# Patient Record
Sex: Female | Born: 1955 | Race: Black or African American | Hispanic: No | Smoking: Never smoker
Health system: Southern US, Community
[De-identification: ages and names within clinical notes are randomized; demographics above are authoritative.]

## PROBLEM LIST (undated history)

## (undated) DIAGNOSIS — I10 Essential (primary) hypertension: Secondary | ICD-10-CM

## (undated) DIAGNOSIS — J45909 Unspecified asthma, uncomplicated: Secondary | ICD-10-CM

---

## 2014-10-25 ENCOUNTER — Telehealth: Payer: Self-pay | Admitting: General Practice

## 2014-10-25 NOTE — Telephone Encounter (Signed)
Patient informed and patient stated she will be applying for [place] Medicaid, advised patient we do not accept medicaid patient voice understanding and will find another provider.

## 2014-10-25 NOTE — Telephone Encounter (Signed)
Relation to ZO:XWRU Call back number: [phone]   Reason for call:  Patient mother and sister Adele Schilder)  are patients of PCP. Patient is new to [place] and would like to establish care with PCP. Patient will follow to [place].

## 2014-10-25 NOTE — Telephone Encounter (Signed)
Ok to establish 

## 2019-02-13 ENCOUNTER — Emergency Department (HOSPITAL_BASED_OUTPATIENT_CLINIC_OR_DEPARTMENT_OTHER)
Admission: EM | Admit: 2019-02-13 | Discharge: 2019-02-13 | Disposition: A | Discharge status: Home / Self Care | Payer: Medicare Other | Attending: Emergency Medicine | Admitting: Emergency Medicine

## 2019-02-13 ENCOUNTER — Other Ambulatory Visit: Payer: Self-pay

## 2019-02-13 ENCOUNTER — Encounter (HOSPITAL_BASED_OUTPATIENT_CLINIC_OR_DEPARTMENT_OTHER): Payer: Self-pay

## 2019-02-13 ENCOUNTER — Emergency Department (HOSPITAL_BASED_OUTPATIENT_CLINIC_OR_DEPARTMENT_OTHER): Payer: Medicare Other

## 2019-02-13 DIAGNOSIS — J45909 Unspecified asthma, uncomplicated: Secondary | ICD-10-CM | POA: Diagnosis not present

## 2019-02-13 DIAGNOSIS — R109 Unspecified abdominal pain: Secondary | ICD-10-CM | POA: Diagnosis present

## 2019-02-13 DIAGNOSIS — K5732 Diverticulitis of large intestine without perforation or abscess without bleeding: Secondary | ICD-10-CM | POA: Diagnosis not present

## 2019-02-13 DIAGNOSIS — Z79899 Other long term (current) drug therapy: Secondary | ICD-10-CM | POA: Insufficient documentation

## 2019-02-13 DIAGNOSIS — I10 Essential (primary) hypertension: Secondary | ICD-10-CM | POA: Insufficient documentation

## 2019-02-13 HISTORY — DX: Unspecified asthma, uncomplicated: J45.909

## 2019-02-13 HISTORY — DX: Essential (primary) hypertension: I10

## 2019-02-13 LAB — COMPREHENSIVE METABOLIC PANEL
ALT: 22 U/L (ref 0–44)
AST: 22 U/L (ref 15–41)
Albumin: 4.3 g/dL (ref 3.5–5.0)
Alkaline Phosphatase: 77 U/L (ref 38–126)
Anion gap: 10 (ref 5–15)
BUN: 11 mg/dL (ref 8–23)
CO2: 23 mmol/L (ref 22–32)
Calcium: 9.3 mg/dL (ref 8.9–10.3)
Chloride: 102 mmol/L (ref 98–111)
Creatinine, Ser: 1.16 mg/dL — ABNORMAL HIGH (ref 0.44–1.00)
GFR calc Af Amer: 58 mL/min — ABNORMAL LOW (ref >60)
GFR calc non Af Amer: 50 mL/min — ABNORMAL LOW (ref >60)
Glucose, Bld: 108 mg/dL — ABNORMAL HIGH (ref 70–99)
Potassium: 3.5 mmol/L (ref 3.5–5.1)
Sodium: 135 mmol/L (ref 135–145)
Total Bilirubin: 0.6 mg/dL (ref 0.3–1.2)
Total Protein: 7.9 g/dL (ref 6.5–8.1)

## 2019-02-13 LAB — URINALYSIS, ROUTINE W REFLEX MICROSCOPIC
Bilirubin Urine: NEGATIVE
Glucose, UA: NEGATIVE mg/dL
Hgb urine dipstick: NEGATIVE
Ketones, ur: NEGATIVE mg/dL
Nitrite: NEGATIVE
Protein, ur: NEGATIVE mg/dL
Specific Gravity, Urine: 1.01 (ref 1.005–1.030)
pH: 6 (ref 5.0–8.0)

## 2019-02-13 LAB — CBC
HCT: 42.1 % (ref 36.0–46.0)
Hemoglobin: 13.9 g/dL (ref 12.0–15.0)
MCH: 30.1 pg (ref 26.0–34.0)
MCHC: 33 g/dL (ref 30.0–36.0)
MCV: 91.1 fL (ref 80.0–100.0)
Platelets: 262 10*3/uL (ref 150–400)
RBC: 4.62 MIL/uL (ref 3.87–5.11)
RDW: 12.4 % (ref 11.5–15.5)
WBC: 12.5 10*3/uL — ABNORMAL HIGH (ref 4.0–10.5)
nRBC: 0 % (ref 0.0–0.2)

## 2019-02-13 LAB — URINALYSIS, MICROSCOPIC (REFLEX)

## 2019-02-13 LAB — LIPASE, BLOOD: Lipase: 34 U/L (ref 11–51)

## 2019-02-13 MED ORDER — MORPHINE SULFATE (PF) 4 MG/ML IV SOLN
4.0000 mg | Freq: Once | INTRAVENOUS | Status: AC
  Administered 2019-02-13: 15:00:00 4 mg via INTRAVENOUS
  Filled 2019-02-13: qty 1

## 2019-02-13 MED ORDER — HYDROCODONE-ACETAMINOPHEN 5-325 MG PO TABS: 1.0000 | ORAL_TABLET | Freq: Four times a day (QID) | ORAL | 0 refills | Status: AC | PRN

## 2019-02-13 MED ORDER — CIPROFLOXACIN HCL 500 MG PO TABS: 500.0000 mg | ORAL_TABLET | Freq: Two times a day (BID) | ORAL | 0 refills | Status: AC

## 2019-02-13 MED ORDER — METRONIDAZOLE 500 MG PO TABS: 500.0000 mg | ORAL_TABLET | Freq: Two times a day (BID) | ORAL | 0 refills | Status: AC

## 2019-02-13 MED ORDER — SODIUM CHLORIDE 0.9 % IV BOLUS
1000.0000 mL | Freq: Once | INTRAVENOUS | Status: AC
  Administered 2019-02-13: 15:00:00 1000 mL via INTRAVENOUS

## 2019-02-13 MED ORDER — CIPROFLOXACIN HCL 500 MG PO TABS
500.0000 mg | ORAL_TABLET | Freq: Once | ORAL | Status: AC
  Administered 2019-02-13: 500 mg via ORAL
  Filled 2019-02-13: qty 1

## 2019-02-13 MED ORDER — IOHEXOL 300 MG/ML  SOLN
100.0000 mL | Freq: Once | INTRAMUSCULAR | Status: AC | PRN
  Administered 2019-02-13: 16:00:00 100 mL via INTRAVENOUS

## 2019-02-13 MED ORDER — ONDANSETRON 4 MG PO TBDP: ORAL_TABLET | ORAL | 0 refills | Status: AC

## 2019-02-13 MED ORDER — ONDANSETRON HCL 4 MG/2ML IJ SOLN
4.0000 mg | Freq: Once | INTRAMUSCULAR | Status: AC
  Administered 2019-02-13: 15:00:00 4 mg via INTRAVENOUS
  Filled 2019-02-13: qty 2

## 2019-02-13 MED ORDER — METRONIDAZOLE 500 MG PO TABS: 500.0000 mg | ORAL_TABLET | Freq: Two times a day (BID) | ORAL | 0 refills | Status: DC

## 2019-02-13 MED ORDER — METRONIDAZOLE 500 MG PO TABS
500.0000 mg | ORAL_TABLET | Freq: Once | ORAL | Status: AC
  Administered 2019-02-13: 17:00:00 500 mg via ORAL
  Filled 2019-02-13: qty 1

## 2019-02-13 NOTE — ED Notes (Signed)
Taken to CT.

## 2019-02-13 NOTE — ED Provider Notes (Signed)
[facility] EMERGENCY DEPARTMENT Provider Note   CSN: [id number] Arrival date & time: 02/13/19  1138     History Chief Complaint  Patient presents with  . Abdominal Pain    Chelsea Schmidt is a 64 y.o. female.  Chelsea Schmidt is a 64 y.o. female with a history of hypertension, who presents to the emergency department for evaluation of lower abdominal pain.  She reports around 3 AM this morning she woke up experiencing sharp stabbing pains in her lower abdomen that had been constant throughout the day.  She states pain is present on both sides but is sometimes worse on the right.  She has not had any associated vomiting or diarrhea but does report some decreased appetite.  She denies any constipation, states she takes a daily fiber supplement to help keep her bowels moving regularly.  She has not noticed any dark or bloody bowel movements.  No fevers or chills.  No burning or pain with urination, no flank pain or hematuria.  No chest pain or shortness of breath.  She has not had any abdominal surgeries aside from tubal ligation many years ago.  Denies history of similar symptoms, has not taken anything to treat the symptoms prior to arrival.  No other aggravating or alleviating factors.        Past Medical History:  Diagnosis Date  . Asthma   . Hypertension     There are no problems to display for this patient.   History reviewed. No pertinent surgical history.   OB History   No obstetric history on file.     No family history on file.  Social History   Tobacco Use  . Smoking status: Never Smoker  . Smokeless tobacco: Never Used  Substance Use Topics  . Alcohol use: Never  . Drug use: Never    Home Medications Prior to Admission medications   Medication Sig Start Date End Date Taking? Authorizing Provider  ALPRAZolam Duanne Moron) 0.5 MG tablet Take 1 to 3 tabs a day as needed for anxiety/panic attacks 01/26/19  Yes [provider]  benazepril  (LOTENSIN) 20 MG tablet Take by mouth. 09/18/16  Yes [provider]  benazepril-hydrochlorthiazide (LOTENSIN HCT) 20-12.5 MG tablet Take by mouth. 08/06/18  Yes [provider]  DULoxetine (CYMBALTA) 60 MG capsule Take  2 a day 08/12/16  Yes [provider]  metoprolol succinate (TOPROL-XL) 50 MG 24 hr tablet Take by mouth. 02/20/18 02/20/19 Yes [provider]  risperiDONE (RISPERDAL) 3 MG tablet Take 1 at night 01/26/19  Yes [provider]  simvastatin (ZOCOR) 40 MG tablet TAKE 1 TABLET BY MOUTH  NIGHTLY 10/02/16  Yes [provider]  topiramate (TOPAMAX) 50 MG tablet Take 2 tab daily 01/26/19  Yes [provider]  ciprofloxacin (CIPRO) 500 MG tablet Take 1 tablet (500 mg total) by mouth 2 (two) times daily. One po bid x 7 days 02/13/19   Jacqlyn Larsen, PA-C  HYDROcodone-acetaminophen (NORCO/VICODIN) 5-325 MG tablet Take 1-2 tablets by mouth every 6 (six) hours as needed. 02/13/19   Jacqlyn Larsen, PA-C  metroNIDAZOLE (FLAGYL) 500 MG tablet Take 1 tablet (500 mg total) by mouth 2 (two) times daily. One po bid x 7 days 02/13/19   Jacqlyn Larsen, PA-C  Multiple Vitamin (MULTIVITAMIN) capsule Take by mouth.    [provider]  ondansetron (ZOFRAN ODT) 4 MG disintegrating tablet 4mg  ODT q4 hours prn nausea/vomit 02/13/19   Jacqlyn Larsen, PA-C  Allergies    Aripiprazole  Review of Systems   Review of Systems  Constitutional: Positive for appetite change. Negative for chills and fever.  HENT: Negative.   Respiratory: Negative for cough and shortness of breath.   Cardiovascular: Negative for chest pain.  Gastrointestinal: Positive for abdominal pain. Negative for blood in stool, constipation, diarrhea, nausea and vomiting.  Genitourinary: Negative for dysuria, flank pain, frequency, hematuria, vaginal bleeding and vaginal discharge.  Musculoskeletal: Negative for back pain.  Skin: Negative for color change and rash.  Neurological:  Negative for dizziness, syncope and light-headedness.    Physical Exam Updated Vital Signs BP 107/62 (BP Location: Left Arm)   Pulse 84   Temp 98.5 F (36.9 C) (Oral)   Resp 16   Ht 5\' 5"  (1.651 m)   Wt 86.2 kg   SpO2 97%   BMI 31.62 kg/m   Physical Exam Vitals and nursing note reviewed.  Constitutional:      General: She is not in acute distress.    Appearance: She is well-developed. She is obese. She is not ill-appearing or diaphoretic.  HENT:     Head: Normocephalic and atraumatic.  Eyes:     General:        Right eye: No discharge.        Left eye: No discharge.     Pupils: Pupils are equal, round, and reactive to light.  Cardiovascular:     Rate and Rhythm: Normal rate and regular rhythm.     Heart sounds: Normal heart sounds.  Pulmonary:     Effort: Pulmonary effort is normal. No respiratory distress.     Breath sounds: Normal breath sounds. No wheezing or rales.     Comments: Respirations equal and unlabored, patient able to speak in full sentences, lungs clear to auscultation bilaterally Abdominal:     General: Bowel sounds are normal. There is no distension.     Palpations: Abdomen is soft. There is no mass.     Tenderness: There is abdominal tenderness in the right lower quadrant, suprapubic area and left lower quadrant. There is no guarding.     Comments: Abdomen is soft, nondistended, bowel sounds present throughout, there is tenderness across the lower abdomen, worse on the right than the left, no focal guarding, rigidity or peritoneal signs.  No CVA tenderness bilaterally.  Musculoskeletal:        General: No deformity.     Cervical back: Neck supple.  Skin:    General: Skin is warm and dry.     Capillary Refill: Capillary refill takes less than 2 seconds.  Neurological:     Mental Status: She is alert.     Coordination: Coordination normal.     Comments: Speech is clear, able to follow commands Moves extremities without ataxia, coordination intact    Psychiatric:        Mood and Affect: Mood normal.        Behavior: Behavior normal.     ED Results / Procedures / Treatments   Labs (all labs ordered are listed, but only abnormal results are displayed) Labs Reviewed  COMPREHENSIVE METABOLIC PANEL - Abnormal; Notable for the following components:      Result Value   Glucose, Bld 108 (*)    Creatinine, Ser 1.16 (*)    GFR calc non Af Amer 50 (*)    GFR calc Af Amer 58 (*)    All other components within normal limits  CBC - Abnormal; Notable for the following components:  WBC 12.5 (*)    All other components within normal limits  URINALYSIS, ROUTINE W REFLEX MICROSCOPIC - Abnormal; Notable for the following components:   Leukocytes,Ua TRACE (*)    All other components within normal limits  URINALYSIS, MICROSCOPIC (REFLEX) - Abnormal; Notable for the following components:   Bacteria, UA RARE (*)    All other components within normal limits  LIPASE, BLOOD    EKG None  Radiology CT Abdomen Pelvis W Contrast  Result Date: 02/13/2019 CLINICAL DATA:  Right lower quadrant abdominal pain EXAM: CT ABDOMEN AND PELVIS WITH CONTRAST TECHNIQUE: Multidetector CT imaging of the abdomen and pelvis was performed using the standard protocol following bolus administration of intravenous contrast. CONTRAST:  OMNIPAQUE IOHEXOL 300 MG/ML  SOLN COMPARISON:  None. FINDINGS: Lower chest: There is mild bibasilar atelectasis. Hepatobiliary: No focal liver abnormality is seen. No gallstones, gallbladder wall thickening, or biliary dilatation. Pancreas: Unremarkable. No pancreatic ductal dilatation or surrounding inflammatory changes. Spleen: Normal in size without focal abnormality. Adrenals/Urinary Tract: Adrenal glands are unremarkable. Kidneys are normal, without renal calculi, focal lesion, or hydronephrosis. Bladder is unremarkable. Stomach/Bowel: Stomach is within normal limits. There is a short segment of bowel wall thickening and associated fat  stranding involving a short segment of the sigmoid colon, consistent with acute diverticulitis. There is no evidence of fistula or abscess formation. There is diverticulosis without evidence of diverticulitis involving the descending colon. Appendix appears normal. No evidence of bowel obstruction. Vascular/Lymphatic: No significant vascular findings are present. No enlarged abdominal or pelvic lymph nodes. Reproductive: Uterus and bilateral adnexa are unremarkable. Other: No abdominal wall hernia or abnormality. No abdominopelvic ascites. Musculoskeletal: No acute or significant osseous findings. IMPRESSION: 1. Bowel wall thickening and associated fat stranding involving a short segment of the sigmoid colon, consistent with acute diverticulitis. No evidence of fistula or abscess formation. Electronically Signed   By: Romona Curls M.D.   On: 02/13/2019 16:05    Procedures Procedures (including critical care time)  Medications Ordered in ED Medications  sodium chloride 0.9 % bolus 1,000 mL (0 mLs Intravenous Stopped 02/13/19 1726)  ondansetron (ZOFRAN) injection 4 mg (4 mg Intravenous Given 02/13/19 1456)  morphine 4 MG/ML injection 4 mg (4 mg Intravenous Given 02/13/19 1456)  iohexol (OMNIPAQUE) 300 MG/ML solution 100 mL (100 mLs Intravenous Contrast Given 02/13/19 1548)  metroNIDAZOLE (FLAGYL) tablet 500 mg (500 mg Oral Given 02/13/19 1637)  ciprofloxacin (CIPRO) tablet 500 mg (500 mg Oral Given 02/13/19 1637)    ED Course  I have reviewed the triage vital signs and the nursing notes.  Pertinent labs & imaging results that were available during my care of the patient were reviewed by me and considered in my medical decision making (see chart for details).  Clinical Course as of Feb 12 1741  Sat Feb 13, 2019  4578 64 year old female presents with sharp pains across her lower abdomen that began at 3 AM this morning no associated vomiting or abnormal bowel movements, no fevers.  No associated urinary  symptoms, vaginal discharge or bleeding.  Differential includes diverticulitis, appendicitis, colitis, without urinary symptoms feel UTI is less likely.   [KF]  1520 CBC with mild leukocytosis, normal hemoglobin  CBC(!) [KF]  1520 Mild bump in creatinine at 1.16, no other significant electrolyte derangements, normal liver function and lipase  Comprehensive metabolic panel(!) [KF]  1520 Urinalysis without signs of infection  Urinalysis, Routine w reflex microscopic(!) [KF]  1615 CT consistent with uncomplicated sigmoid diverticulitis  CT Abdomen Pelvis W  Contrast [KF]    Clinical Course User Index [KF] Legrand Rams   MDM Rules/Calculators/A&P                      64 year old female presents with sharp stabbing lower abdominal pains that began this morning.  Lab work is overall reassuring, CT is consistent with uncomplicated diverticulitis her pain has improved significantly here in the ED and she is tolerating p.o., she has been given her first dose of antibiotics, will send home with treatment with Cipro, Flagyl and pain medication.  PCP and GI follow-up encouraged.  Strict return precautions discussed.  Patient expresses understanding and agreement.  Discharged home in good condition.  Final Clinical Impression(s) / ED Diagnoses Final diagnoses:  Sigmoid diverticulitis    Rx / DC Orders ED Discharge Orders         Ordered    ciprofloxacin (CIPRO) 500 MG tablet  2 times daily     02/13/19 1733    metroNIDAZOLE (FLAGYL) 500 MG tablet  2 times daily,   Status:  Discontinued     02/13/19 1733    HYDROcodone-acetaminophen (NORCO/VICODIN) 5-325 MG tablet  Every 6 hours PRN     02/13/19 1733    ondansetron (ZOFRAN ODT) 4 MG disintegrating tablet     02/13/19 1733    metroNIDAZOLE (FLAGYL) 500 MG tablet  2 times daily     02/13/19 1739           Dartha Lodge, [place] 02/13/19 1743    Tegeler, Canary Brim, MD 02/14/19 [phone]

## 2019-02-13 NOTE — ED Triage Notes (Signed)
Pt c/o "stabbing pains" in lower abdomen starting around 3 am today. Denies NVD

## 2019-02-13 NOTE — Discharge Instructions (Signed)
Your CT scan shows diverticulitis.  Please take antibiotics twice daily as directed, take these with food on your stomach, you can use hydrocodone as needed for pain, continue using your home bowel regimen to avoid constipation.  Follow-up with your primary care doctor.  If you develop worsening abdominal pain, fevers, persistent vomiting, blood in your stool, return to the ED for reevaluation.

## 2019-03-06 ENCOUNTER — Other Ambulatory Visit: Payer: Self-pay

## 2019-03-06 ENCOUNTER — Emergency Department (HOSPITAL_BASED_OUTPATIENT_CLINIC_OR_DEPARTMENT_OTHER)
Admission: EM | Admit: 2019-03-06 | Discharge: 2019-03-06 | Disposition: A | Discharge status: Home / Self Care | Payer: Medicare Other | Attending: Emergency Medicine | Admitting: Emergency Medicine

## 2019-03-06 ENCOUNTER — Encounter (HOSPITAL_BASED_OUTPATIENT_CLINIC_OR_DEPARTMENT_OTHER): Payer: Self-pay | Admitting: Emergency Medicine

## 2019-03-06 DIAGNOSIS — Z79899 Other long term (current) drug therapy: Secondary | ICD-10-CM | POA: Diagnosis not present

## 2019-03-06 DIAGNOSIS — J45909 Unspecified asthma, uncomplicated: Secondary | ICD-10-CM | POA: Insufficient documentation

## 2019-03-06 DIAGNOSIS — K5732 Diverticulitis of large intestine without perforation or abscess without bleeding: Secondary | ICD-10-CM | POA: Diagnosis not present

## 2019-03-06 DIAGNOSIS — R1032 Left lower quadrant pain: Secondary | ICD-10-CM | POA: Diagnosis present

## 2019-03-06 DIAGNOSIS — I10 Essential (primary) hypertension: Secondary | ICD-10-CM | POA: Diagnosis not present

## 2019-03-06 DIAGNOSIS — K5792 Diverticulitis of intestine, part unspecified, without perforation or abscess without bleeding: Secondary | ICD-10-CM

## 2019-03-06 DIAGNOSIS — Z888 Allergy status to other drugs, medicaments and biological substances status: Secondary | ICD-10-CM | POA: Insufficient documentation

## 2019-03-06 MED ORDER — AMOXICILLIN-POT CLAVULANATE 875-125 MG PO TABS: 1.0000 | ORAL_TABLET | Freq: Two times a day (BID) | ORAL | 0 refills | Status: AC

## 2019-03-06 NOTE — ED Provider Notes (Signed)
[facility] EMERGENCY DEPARTMENT Provider Note   CSN: [id number] Arrival date & time: 03/06/19  5329     History Chief Complaint  Patient presents with  . Abdominal Pain    Chelsea Schmidt is a 64 y.o. female with a past medical history significant for hyperlipidemia, hypertension, asthma, bipolar disease, and history of diverticulitis who presents to the ED due to gradual onset of bilateral lower abdominal pain x 1 week. Patient notes her pain is typically in her LLQ and radiated to her RLQ. Chart reviewed.  Patient was diagnosed with diverticulitis on 02/13/2019 and was prescribed ciprofloxacin and Flagyl x7 days.  Patient states she was compliant with her antibiotics and finished all antibiotics.  Patient notes her abdominal pain returned 1 week after she finished her antibiotics.  Patient denies bloody stools and melena.  Patient notes this pain feels similar in her past episode of diverticulitis.  Patient denies fever and chills.  Patient has not been sexually active in over 10 years and denies concerns for STDs. Patient was seen by GI on 02/25/19 and is scheduled to have an EGD/colonoscopy in mid March. Her last EGD/colonoscopy was on 08/11/15 which demonstrated distal esophagitis (negative for Barrett's), gastritis (negative for H. Pylori), diffuse duodenitis, mild sigmoid diverticulosis, and small internal hemorrhoids. Patient denies vaginal and urinary symptoms. Her only abdominal operation is a tubal ligation. Patient denies nausea, vomiting, shortness of breath, and chest pain.     Past Medical History:  Diagnosis Date  . Asthma   . Hypertension     There are no problems to display for this patient.   History reviewed. No pertinent surgical history.   OB History   No obstetric history on file.     No family history on file.  Social History   Tobacco Use  . Smoking status: Never Smoker  . Smokeless tobacco: Never Used  Substance Use Topics  . Alcohol use:  Never  . Drug use: Never    Home Medications Prior to Admission medications   Medication Sig Start Date End Date Taking? Authorizing Provider  ALPRAZolam Prudy Feeler) 0.5 MG tablet Take 1 to 3 tabs a day as needed for anxiety/panic attacks 01/26/19  Yes [provider]  benazepril-hydrochlorthiazide (LOTENSIN HCT) 20-12.5 MG tablet Take by mouth. 08/06/18  Yes [provider]  DULoxetine (CYMBALTA) 60 MG capsule Take  2 a day 08/12/16  Yes [provider]  Multiple Vitamin (MULTIVITAMIN) capsule Take by mouth.   Yes [provider]  risperiDONE (RISPERDAL) 3 MG tablet Take 1 at night 01/26/19  Yes [provider]  simvastatin (ZOCOR) 40 MG tablet TAKE 1 TABLET BY MOUTH  NIGHTLY 10/02/16  Yes [provider]  topiramate (TOPAMAX) 50 MG tablet Take 2 tab daily 01/26/19  Yes [provider]  amoxicillin-clavulanate (AUGMENTIN) 875-125 MG tablet Take 1 tablet by mouth every 12 (twelve) hours for 14 days. 03/06/19 03/20/19  Mannie Stabile, PA-C  benazepril (LOTENSIN) 20 MG tablet Take by mouth. 09/18/16   [provider]  ciprofloxacin (CIPRO) 500 MG tablet Take 1 tablet (500 mg total) by mouth 2 (two) times daily. One po bid x 7 days 02/13/19   Dartha Lodge, PA-C  HYDROcodone-acetaminophen (NORCO/VICODIN) 5-325 MG tablet Take 1-2 tablets by mouth every 6 (six) hours as needed. 02/13/19   Dartha Lodge, PA-C  metoprolol succinate (TOPROL-XL) 50 MG 24 hr tablet Take by mouth. 02/20/18 02/20/19  [provider]  metroNIDAZOLE (FLAGYL) 500 MG tablet Take 1  tablet (500 mg total) by mouth 2 (two) times daily. One po bid x 7 days 02/13/19   Dartha Lodge, PA-C  ondansetron (ZOFRAN ODT) 4 MG disintegrating tablet 4mg  ODT q4 hours prn nausea/vomit 02/13/19   04/13/19, PA-C    Allergies    Aripiprazole  Review of Systems   Review of Systems  Constitutional: Negative for chills and fever.  Respiratory: Negative for shortness of  breath.   Cardiovascular: Negative for chest pain.  Gastrointestinal: Positive for abdominal pain, constipation (chronic) and diarrhea (1 episode of loose stool this AM). Negative for abdominal distention, anal bleeding, blood in stool, nausea and vomiting.  Genitourinary: Negative for difficulty urinating, dysuria, hematuria, vaginal bleeding, vaginal discharge and vaginal pain.  Musculoskeletal: Negative for back pain.  All other systems reviewed and are negative.   Physical Exam Updated Vital Signs BP 124/83 (BP Location: Left Arm)   Pulse 83   Temp 98.2 F (36.8 C) (Oral)   Resp 16   Ht 5\' 5"  (1.651 m)   Wt 86.2 kg   SpO2 100%   BMI 31.62 kg/m   Physical Exam Vitals and nursing note reviewed.  Constitutional:      General: She is not in acute distress.    Appearance: She is not ill-appearing.  HENT:     Head: Normocephalic.  Eyes:     Pupils: Pupils are equal, round, and reactive to light.  Cardiovascular:     Rate and Rhythm: Normal rate and regular rhythm.     Pulses: Normal pulses.     Heart sounds: Normal heart sounds. No murmur. No friction rub. No gallop.   Pulmonary:     Effort: Pulmonary effort is normal.     Breath sounds: Normal breath sounds.  Abdominal:     General: Abdomen is flat. Bowel sounds are normal. There is no distension.     Palpations: Abdomen is soft.     Tenderness: There is abdominal tenderness. There is no right CVA tenderness, left CVA tenderness, guarding or rebound.     Comments: Abdomen soft, non-distended with mild tenderness to palpation in lower quadrants only with deep palpation. No guarding or rebound. No peritoneal signs. No CVA tenderness bilaterally.   Musculoskeletal:     Cervical back: Neck supple.     Comments: Able to move all 4 extremities without difficulty. No lower extremity edema.   Skin:    General: Skin is warm and dry.  Neurological:     General: No focal deficit present.     Mental Status: She is alert.    Psychiatric:        Mood and Affect: Mood normal.        Behavior: Behavior normal.     ED Results / Procedures / Treatments   Labs (all labs ordered are listed, but only abnormal results are displayed) Labs Reviewed - No data to display  EKG None  Radiology No results found.  Procedures Procedures (including critical care time)  Medications Ordered in ED Medications - No data to display  ED Course  I have reviewed the triage vital signs and the nursing notes.  Pertinent labs & imaging results that were available during my care of the patient were reviewed by me and considered in my medical decision making (see chart for details).    MDM Rules/Calculators/A&P                      64 year old female presents  to the ED due to LLQ abdominal pain that radiates to RLQ. History of diverticulitis on 02/13/19. She was discharged with cipro and Flagyl which she finished. She notes her pain returned roughly a week after she finished her antibiotics. Vitals all within normal limits. Patient in no acute distress and non-ill appearing. Physical exam reassuring. Abdomen soft, non-distended with mild tenderness to palpation in lower quadrants only with deep palpation. No guarding or rebound. No peritoneal signs. No CVA tenderness bilaterally. Patient's last CT on 02/13/19 personally reviewed which demonstrated: 1. Bowel wall thickening and associated fat stranding involving a short segment of the sigmoid colon, consistent with acute diverticulitis. No evidence of fistula or abscess formation.  Suspect recurrent diverticulitis. Given patient has no peritoneal signs, doubt abscess, bowel perforation, bowel obstruction, or other emergent intraabdominal etiology. Discussed case with Dr. Maryan Rued who evaluated patient at bedside and agrees with assessment and plan. Will discharge patient with Augmentin x14 days. Advised patient to follow-up with GI doctor if symptoms do not improve within the next few  days. Strict ED precautions discussed with patient. Patient states understanding and agrees to plan. Patient discharged home in no acute distress and stable vitals   Final Clinical Impression(s) / ED Diagnoses Final diagnoses:  Diverticulitis    Rx / DC Orders ED Discharge Orders         Ordered    amoxicillin-clavulanate (AUGMENTIN) 875-125 MG tablet  Every 12 hours     03/06/19 0955           Suzy Bouchard, PA-C 03/06/19 1630    Blanchie Dessert, MD 03/07/19 [phone]

## 2019-03-06 NOTE — ED Triage Notes (Signed)
Low abd pain for a few days. Hx of diverticulitis. Denies N/V/D

## 2019-03-06 NOTE — Discharge Instructions (Addendum)
As discussed, I am sending you home with another antibiotic. You will take it twice a day for 14 days. Finish all antibiotics even if you start to feel better. If your pain persists after the 14 days, follow-up with your GI doctor. You may take over the counter ibuprofen or tylenol as needed for pain. Return to the ER for worsening pain, fever, bloody stool, or worsening symptoms.

## 2020-07-27 IMAGING — CT CT ABD-PELV W/ CM
2 of 5 series · 17 of 46 positions shown, 19 images · IV contrast (Omnipaque)
Comparison: None.

CLINICAL DATA: Right lower quadrant abdominal pain

EXAM:
CT ABDOMEN AND PELVIS WITH CONTRAST
TECHNIQUE: Multidetector CT imaging of the abdomen and pelvis was performed
using the standard protocol following bolus administration of
intravenous contrast.
CONTRAST:  100mL OMNIPAQUE IOHEXOL 300 MG/ML  SOLN

[Series 2: axial st · axial · 0.82mm/px · z∈[-436,-36]mm · 14 of 90 slices shown, 16 images]
[im 5/90  soft-tissue]
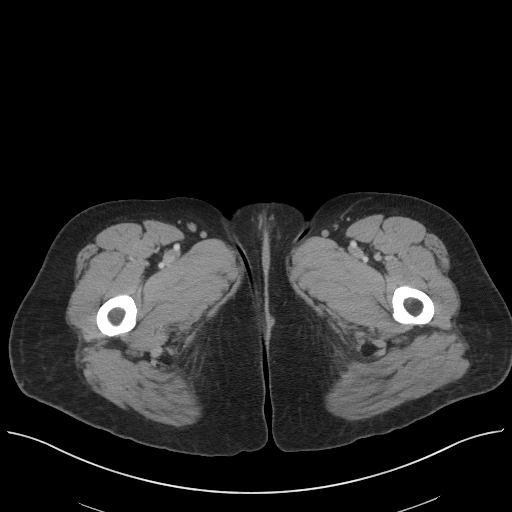
[im 5/90  bone]
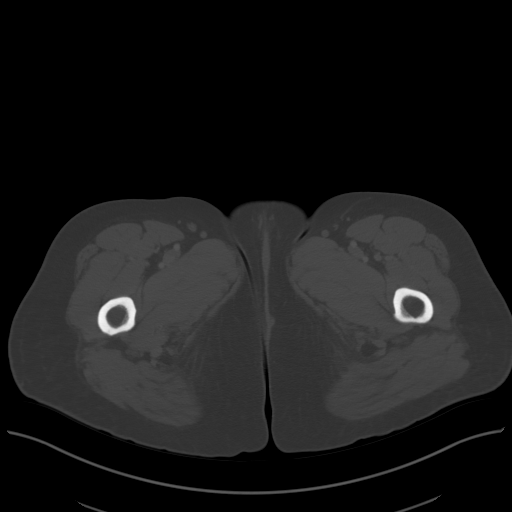
[im 14/90  soft-tissue]
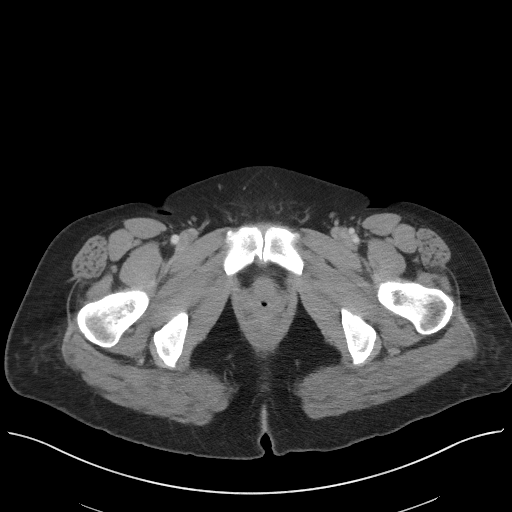
[im 18/90  soft-tissue]
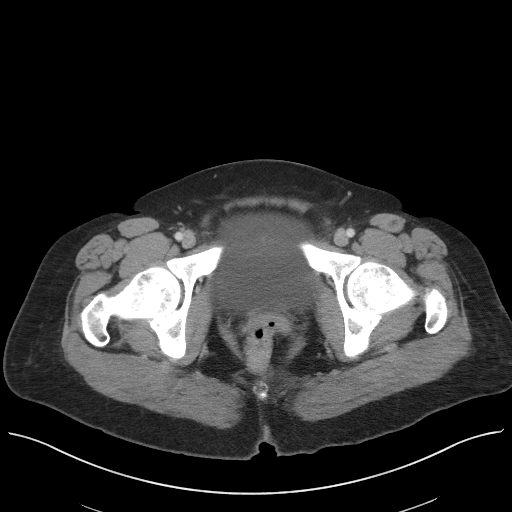
[im 23/90  soft-tissue]
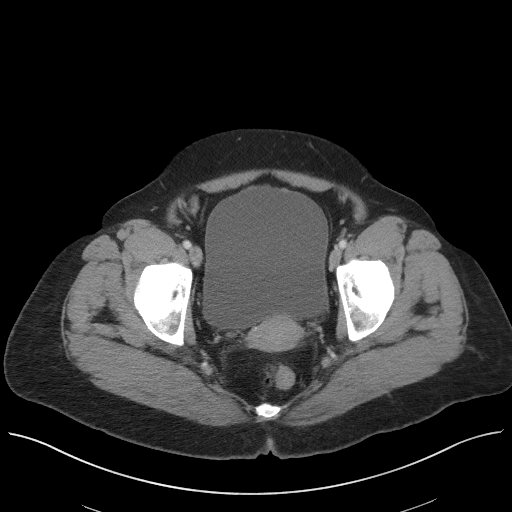
[im 32/90  soft-tissue]
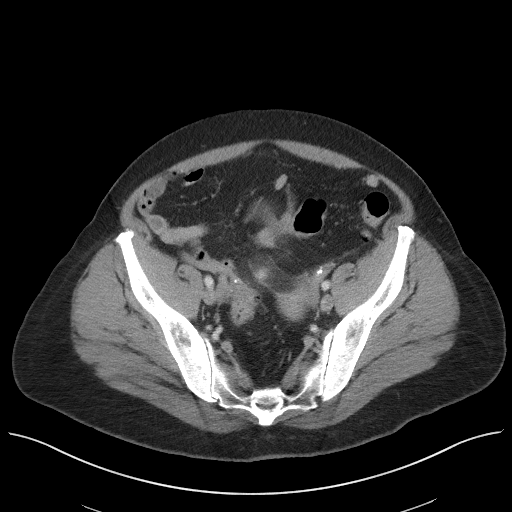
[im 36/90  soft-tissue]
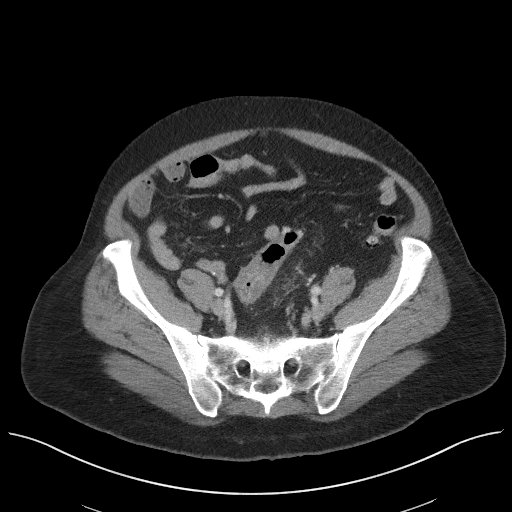
[im 41/90  soft-tissue]
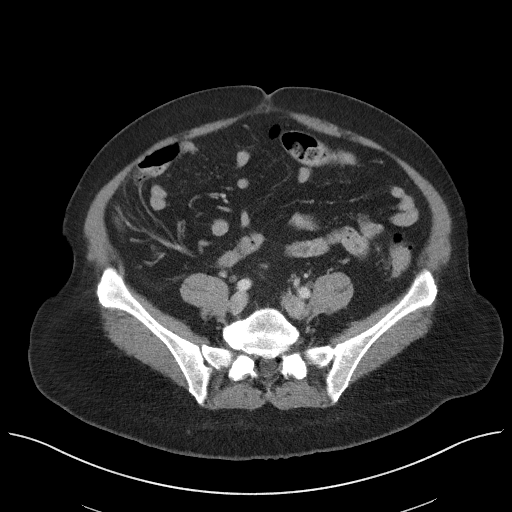
[im 49/90  soft-tissue]
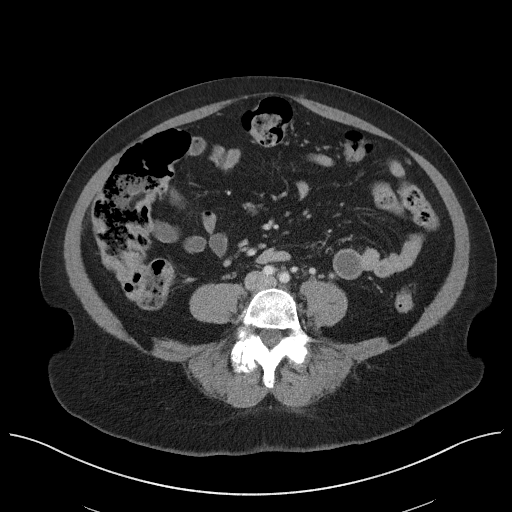
[im 54/90  soft-tissue]
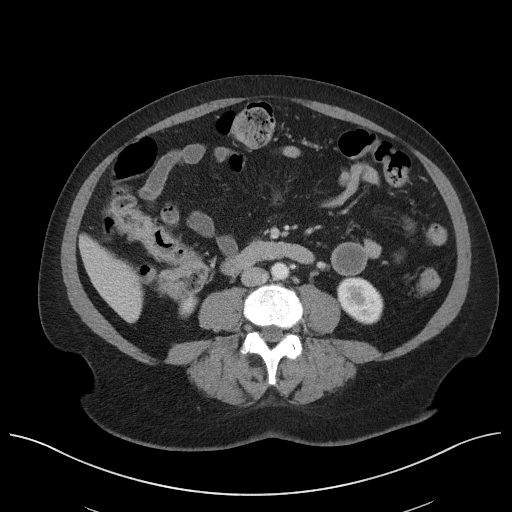
[im 54/90  bone]
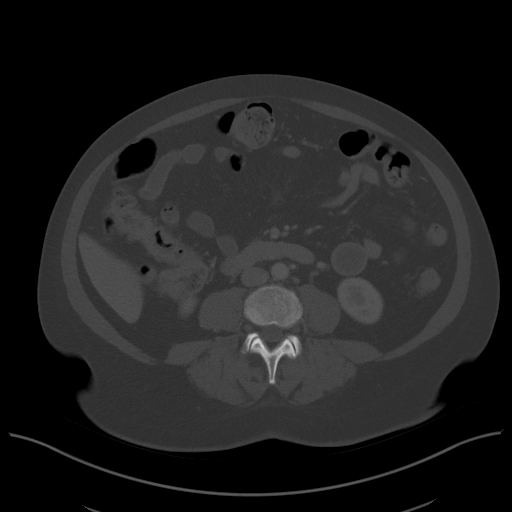
[im 58/90  soft-tissue]
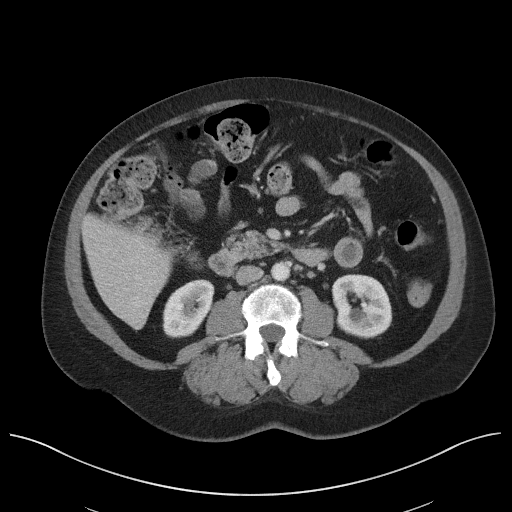
[im 67/90  soft-tissue]
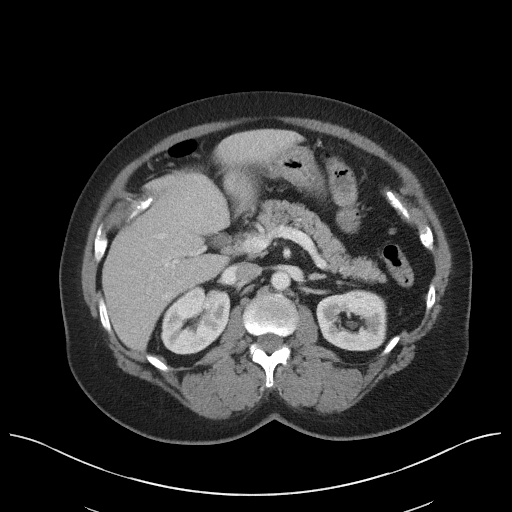
[im 72/90  soft-tissue]
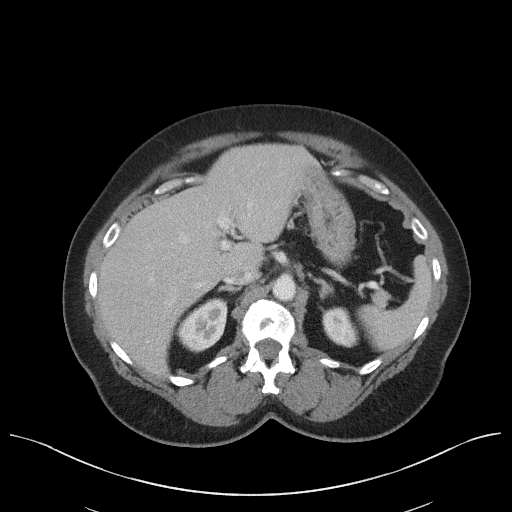
[im 76/90  soft-tissue]
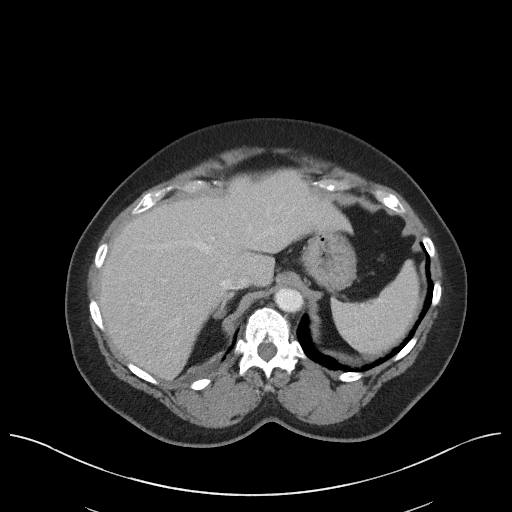
[im 85/90  soft-tissue]
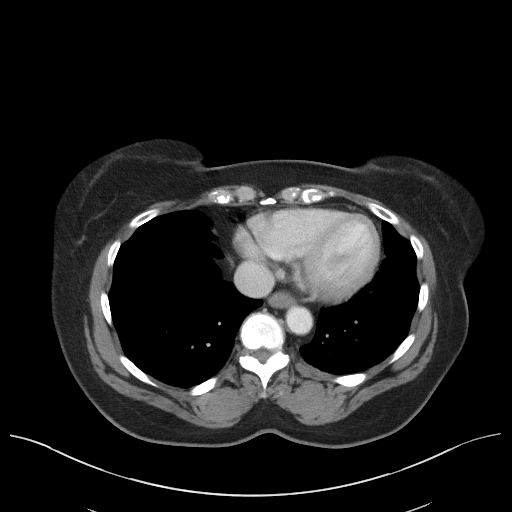

[Series 5: coronal st · coronal · 0.87mm/px · 3 of 101 slices shown]
[im 34/101  soft-tissue]
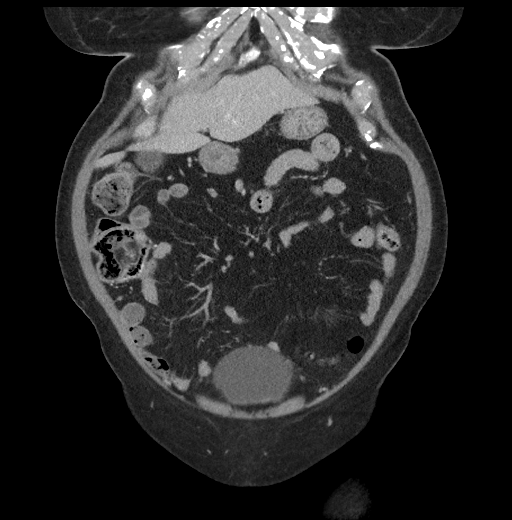
[im 45/101  soft-tissue]
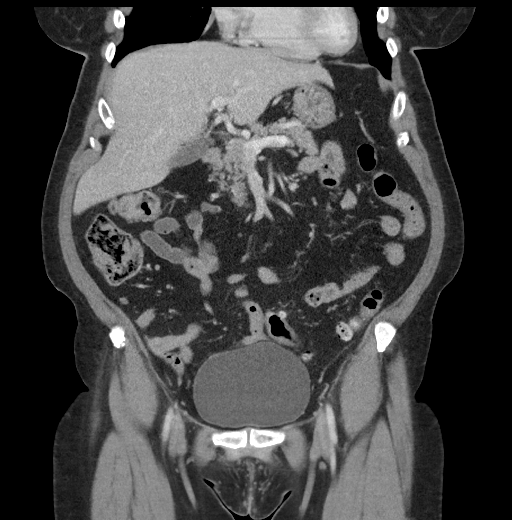
[im 56/101  soft-tissue]
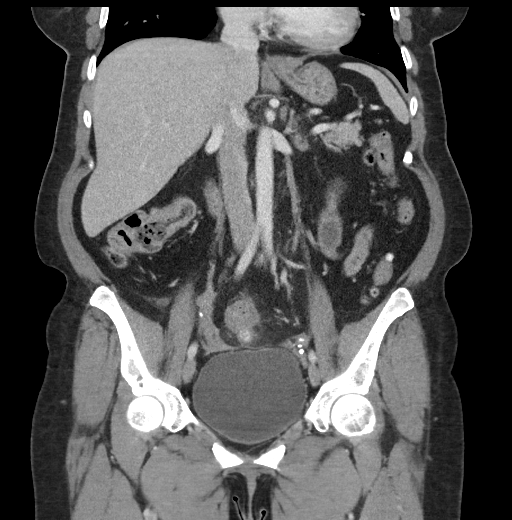

[17 of 46 positions shown; findings below may reference images not displayed]

FINDINGS: Lower chest: There is mild bibasilar atelectasis.

Hepatobiliary: No focal liver abnormality is seen. No gallstones,
gallbladder wall thickening, or biliary dilatation.

Pancreas: Unremarkable. No pancreatic ductal dilatation or
surrounding inflammatory changes.

Spleen: Normal in size without focal abnormality.

Adrenals/Urinary Tract: Adrenal glands are unremarkable. Kidneys are
normal, without renal calculi, focal lesion, or hydronephrosis.
Bladder is unremarkable.

Stomach/Bowel: Stomach is within normal limits. There is a short
segment of bowel wall thickening and associated fat stranding
involving a short segment of the sigmoid colon, consistent with
acute diverticulitis. There is no evidence of fistula or abscess
formation. There is diverticulosis without evidence of
diverticulitis involving the descending colon. Appendix appears
normal. No evidence of bowel obstruction.

Vascular/Lymphatic: No significant vascular findings are present. No
enlarged abdominal or pelvic lymph nodes.

Reproductive: Uterus and bilateral adnexa are unremarkable.

Other: No abdominal wall hernia or abnormality. No abdominopelvic
ascites.

Musculoskeletal: No acute or significant osseous findings.
IMPRESSION: 1. Bowel wall thickening and associated fat stranding involving a
short segment of the sigmoid colon, consistent with acute
diverticulitis. No evidence of fistula or abscess formation.

## 2022-05-06 HISTORY — PX: OTHER SURGICAL HISTORY: SHX169

## 2022-09-08 ENCOUNTER — Encounter (HOSPITAL_BASED_OUTPATIENT_CLINIC_OR_DEPARTMENT_OTHER): Payer: Self-pay | Admitting: Emergency Medicine

## 2022-09-08 ENCOUNTER — Emergency Department (HOSPITAL_BASED_OUTPATIENT_CLINIC_OR_DEPARTMENT_OTHER)
Admission: EM | Admit: 2022-09-08 | Discharge: 2022-09-08 | Disposition: A | Discharge status: Home / Self Care | Payer: Medicare Other | Attending: Emergency Medicine | Admitting: Emergency Medicine

## 2022-09-08 ENCOUNTER — Other Ambulatory Visit: Payer: Self-pay

## 2022-09-08 DIAGNOSIS — I10 Essential (primary) hypertension: Secondary | ICD-10-CM | POA: Insufficient documentation

## 2022-09-08 DIAGNOSIS — Z79899 Other long term (current) drug therapy: Secondary | ICD-10-CM | POA: Diagnosis not present

## 2022-09-08 NOTE — Discharge Instructions (Addendum)
Take your blood pressure once a day.  Continue your blood pressure medicines.  You can bring your blood pressure cuff to the doctor's office to compare it to the readings you get in the office.

## 2022-09-08 NOTE — ED Triage Notes (Signed)
Notices high BP at home seen by PCP . Added hydralazine to regimen. Started Friday.  Some pressure in back of head. And BP still high at home so wants eval

## 2022-09-08 NOTE — ED Provider Notes (Signed)
[place] [facility]PARTMENT AT [facility] Provider Note   CSN: [id number] Arrival date & time: 09/08/22  1638     History  Chief Complaint  Patient presents with   Hypertension    Chelsea Schmidt is a 67 y.o. female.   Hypertension  Patient presents hypertension.  History of hypertension.  Recently seen for hypertension by her PCP on Friday with today being Sunday.  Around 3 weeks ago had her amlodipine stopped because her blood pressure had normalized.  However reported elevated readings at home.  Started on hydralazine.  Of note the blood pressure was normal on the visit on Friday.  Now states still measurements of it going up.  Does have some dull aches at the back of her head she feels at times.  States it tends to be when her blood pressure is up.  No numbness or weakness.  No chest pain.  Takes her measurements on a blood pressure cuff at home but has not correlated with the measurements at the office.     Home Medications Prior to Admission medications   Medication Sig Start Date End Date Taking? Authorizing Provider  ALPRAZolam Prudy Feeler) 0.5 MG tablet Take 1 to 3 tabs a day as needed for anxiety/panic attacks 01/26/19   [provider]  benazepril (LOTENSIN) 20 MG tablet Take by mouth. 09/18/16   [provider]  benazepril-hydrochlorthiazide (LOTENSIN HCT) 20-12.5 MG tablet Take by mouth. 08/06/18   [provider]  ciprofloxacin (CIPRO) 500 MG tablet Take 1 tablet (500 mg total) by mouth 2 (two) times daily. One po bid x 7 days 02/13/19   Dartha Lodge, PA-C  DULoxetine (CYMBALTA) 60 MG capsule Take  2 a day 08/12/16   [provider]  HYDROcodone-acetaminophen (NORCO/VICODIN) 5-325 MG tablet Take 1-2 tablets by mouth every 6 (six) hours as needed. 02/13/19   Dartha Lodge, PA-C  metoprolol succinate (TOPROL-XL) 50 MG 24 hr tablet Take by mouth. 02/20/18 02/20/19  [provider]  metroNIDAZOLE (FLAGYL) 500 MG tablet Take 1  tablet (500 mg total) by mouth 2 (two) times daily. One po bid x 7 days 02/13/19   Dartha Lodge, PA-C  Multiple Vitamin (MULTIVITAMIN) capsule Take by mouth.    [provider]  ondansetron (ZOFRAN ODT) 4 MG disintegrating tablet 4mg  ODT q4 hours prn nausea/vomit 02/13/19   Dartha Lodge, PA-C  risperiDONE (RISPERDAL) 3 MG tablet Take 1 at night 01/26/19   [provider]  simvastatin (ZOCOR) 40 MG tablet TAKE 1 TABLET BY MOUTH  NIGHTLY 10/02/16   [provider]  topiramate (TOPAMAX) 50 MG tablet Take 2 tab daily 01/26/19   [provider]      Allergies    Aripiprazole    Review of Systems   Review of Systems  Physical Exam Updated Vital Signs BP (!) 165/99 (BP Location: Right Arm)   Pulse 70   Temp 98.2 F (36.8 C) (Oral)   Resp 18   SpO2 99%  Physical Exam Vitals and nursing note reviewed.  HENT:     Head: Atraumatic.  Eyes:     Pupils: Pupils are equal, round, and reactive to light.  Cardiovascular:     Rate and Rhythm: Regular rhythm.  Pulmonary:     Effort: Pulmonary effort is normal.  Abdominal:     Tenderness: There is no abdominal tenderness.  Neurological:     Mental Status: She is alert.     ED Results / Procedures / Treatments  Labs (all labs ordered are listed, but only abnormal results are displayed) Labs Reviewed - No data to display  EKG EKG Interpretation Date/Time:  Sunday September 08 2022 17:05:27 EDT Ventricular Rate:  62 PR Interval:  154 QRS Duration:  70 QT Interval:  376 QTC Calculation: 381 R Axis:   55  Text Interpretation: Normal sinus rhythm Possible Left atrial enlargement Borderline ECG No previous ECGs available Confirmed by Benjiman Core [phone]) on 09/08/2022 5:17:57 PM  Radiology No results found.  Procedures Procedures    Medications Ordered in ED Medications - No data to display  ED Course/ Medical Decision Making/ A&P                                 Medical Decision  Making  Patient with hypertension.  History of same.  Recent adjustment of medications.  Doubt endorgan damage.  EKG reassuring.  Reviewed recent kidney function.  No chest pain.  Do not think we need further workup at this time.  Discussed with patient about the nature of high blood pressure.  Will measure her blood pressure once a day.  Will follow-up with PCP.  Just started hydralazine and do not think we need further adjustment of that medicine yet.  Will discharge home.        Final Clinical Impression(s) / ED Diagnoses Final diagnoses:  Hypertension, unspecified type    Rx / DC Orders ED Discharge Orders     None         Benjiman Core, MD 09/08/22 1737
